# Patient Record
Sex: Female | Born: 1995 | Race: White | Hispanic: No | Marital: Single | Smoking: Never smoker
Health system: Southern US, Community
[De-identification: ages and names within clinical notes are randomized; demographics above are authoritative.]

## PROBLEM LIST (undated history)

## (undated) DIAGNOSIS — I10 Essential (primary) hypertension: Secondary | ICD-10-CM

## (undated) DIAGNOSIS — Z87442 Personal history of urinary calculi: Secondary | ICD-10-CM

## (undated) DIAGNOSIS — F32A Depression, unspecified: Secondary | ICD-10-CM

## (undated) DIAGNOSIS — F419 Anxiety disorder, unspecified: Secondary | ICD-10-CM

## (undated) HISTORY — PX: WISDOM TOOTH EXTRACTION: SHX21

---

## 2017-05-06 DIAGNOSIS — F419 Anxiety disorder, unspecified: Secondary | ICD-10-CM | POA: Diagnosis not present

## 2017-05-06 DIAGNOSIS — F329 Major depressive disorder, single episode, unspecified: Secondary | ICD-10-CM | POA: Diagnosis not present

## 2017-05-06 DIAGNOSIS — I10 Essential (primary) hypertension: Secondary | ICD-10-CM | POA: Diagnosis not present

## 2017-07-01 DIAGNOSIS — Z309 Encounter for contraceptive management, unspecified: Secondary | ICD-10-CM | POA: Diagnosis not present

## 2020-02-17 ENCOUNTER — Other Ambulatory Visit: Payer: Self-pay

## 2020-02-17 ENCOUNTER — Encounter (HOSPITAL_BASED_OUTPATIENT_CLINIC_OR_DEPARTMENT_OTHER): Payer: Self-pay | Admitting: *Deleted

## 2020-02-17 ENCOUNTER — Emergency Department (HOSPITAL_BASED_OUTPATIENT_CLINIC_OR_DEPARTMENT_OTHER): Payer: 59

## 2020-02-17 ENCOUNTER — Emergency Department (HOSPITAL_BASED_OUTPATIENT_CLINIC_OR_DEPARTMENT_OTHER)
Admission: EM | Admit: 2020-02-17 | Discharge: 2020-02-17 | Disposition: A | Discharge status: Home / Self Care | Payer: 59 | Attending: Emergency Medicine | Admitting: Emergency Medicine

## 2020-02-17 DIAGNOSIS — L509 Urticaria, unspecified: Secondary | ICD-10-CM

## 2020-02-17 DIAGNOSIS — I10 Essential (primary) hypertension: Secondary | ICD-10-CM | POA: Insufficient documentation

## 2020-02-17 DIAGNOSIS — R21 Rash and other nonspecific skin eruption: Secondary | ICD-10-CM | POA: Diagnosis present

## 2020-02-17 HISTORY — DX: Essential (primary) hypertension: I10

## 2020-02-17 HISTORY — DX: Anxiety disorder, unspecified: F41.9

## 2020-02-17 MED ORDER — FAMOTIDINE IN NACL 20-0.9 MG/50ML-% IV SOLN
20.0000 mg | Freq: Once | INTRAVENOUS | Status: AC
  Administered 2020-02-17: 20 mg via INTRAVENOUS
  Filled 2020-02-17: qty 50

## 2020-02-17 MED ORDER — SODIUM CHLORIDE 0.9 % IV SOLN
INTRAVENOUS | Status: DC | PRN
  Administered 2020-02-17: 100 mL via INTRAVENOUS

## 2020-02-17 MED ORDER — LABETALOL HCL 5 MG/ML IV SOLN
5.0000 mg | Freq: Once | INTRAVENOUS | Status: AC
  Administered 2020-02-17: 5 mg via INTRAVENOUS
  Filled 2020-02-17: qty 4

## 2020-02-17 MED ORDER — EPINEPHRINE 0.3 MG/0.3ML IJ SOAJ
0.3000 mg | Freq: Once | INTRAMUSCULAR | Status: AC
  Administered 2020-02-17: 0.3 mg via INTRAMUSCULAR
  Filled 2020-02-17: qty 0.3

## 2020-02-17 MED ORDER — DEXAMETHASONE SODIUM PHOSPHATE 10 MG/ML IJ SOLN
6.0000 mg | Freq: Once | INTRAMUSCULAR | Status: AC
  Administered 2020-02-17: 6 mg via INTRAVENOUS
  Filled 2020-02-17: qty 1

## 2020-02-17 MED ORDER — PREDNISONE 10 MG PO TABS: 20.0000 mg | ORAL_TABLET | Freq: Two times a day (BID) | ORAL | 0 refills | Status: AC

## 2020-02-17 MED ORDER — METHYLPREDNISOLONE SODIUM SUCC 125 MG IJ SOLR
125.0000 mg | Freq: Once | INTRAMUSCULAR | Status: AC
  Administered 2020-02-17: 125 mg via INTRAVENOUS
  Filled 2020-02-17: qty 2

## 2020-02-17 MED ORDER — DIPHENHYDRAMINE HCL 50 MG/ML IJ SOLN
50.0000 mg | Freq: Once | INTRAMUSCULAR | Status: AC
  Administered 2020-02-17: 50 mg via INTRAVENOUS
  Filled 2020-02-17: qty 1

## 2020-02-17 MED ORDER — EPINEPHRINE 0.3 MG/0.3ML IJ SOAJ: 0.3000 mg | INTRAMUSCULAR | 0 refills | Status: AC | PRN

## 2020-02-17 NOTE — ED Provider Notes (Signed)
[facility] EMERGENCY DEPARTMENT Provider Note   CSN: [id number] Arrival date & time: 02/17/20  1406     History Chief Complaint  Patient presents with  . Allergic Reaction    Carrie Bray is a 25 y.o. female who presents with concern for itchy rash on her face, neck, trunk, and arms for the last week.  Patient states that she had COVID-19 in November 2021 and subsequently had 2 episodes of seemingly spontaneous urticaria on her trunk and her arms each of which resolved after administration of Benadryl and Zyrtec.  Patient states she had a third episode of urticaria that erupted approximately 6 days ago.  Patient had a telemedicine visit and was prescribed a prednisone taper. She has additionally been taking Benadryl and Zyrtec at home.  She endorsed significant improvement with prednisone taper, however completed her taper yesterday. She states that she did not take any medicines yesterday after her morning dose of prednisone, and did not have any issue.  Unfortunately, she woke up this morning with swelling around her eyes, redness, urticaria, and itching to her face, neck, chest, and arms this morning.  She has not taken any medications at all today, but presented to the emergency department due to concern for persistent urticaria despite multiple medications at home.  Additionally patient has not taken hypertension medication due to concern that it may be contributing to her reaction.  I personally reviewed this patient's medical records.  She has history of anxiety and hypertension on carvedilol.  Patient has not taken carvedilol today. Hx of similar reaction to clindamycin in the past.  Patient denies any new medications, soaps, or exposures in her home.  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  HPI     Past Medical History:  Diagnosis Date  . Anxiety   . Hypertension     There are no problems to display for this patient.   History reviewed. No pertinent  surgical history.   OB History   No obstetric history on file.     History reviewed. No pertinent family history.  Social History   Tobacco Use  . Smoking status: Never Smoker  . Smokeless tobacco: Never Used  Vaping Use  . Vaping Use: Some days  Substance Use Topics  . Alcohol use: Yes    Comment: soc  . Drug use: Not Currently    Home Medications Prior to Admission medications   Medication Sig Start Date End Date Taking? Authorizing Provider  EPINEPHrine 0.3 mg/0.3 mL IJ SOAJ injection Inject 0.3 mg into the muscle as needed for anaphylaxis. 02/17/20  Yes Shoji Pertuit, Lupe Carney R, PA-C  predniSONE (DELTASONE) 10 MG tablet Take 2 tablets (20 mg total) by mouth 2 (two) times daily for 3 days. 02/17/20 02/20/20 Yes Sly Parlee, Eugene Gavia, PA-C    Allergies    Clindamycin  Review of Systems   Review of Systems  Constitutional: Negative for activity change, appetite change, chills, diaphoresis, fatigue, fever and unexpected weight change.  HENT: Negative.  Negative for sore throat, trouble swallowing and voice change.   Eyes: Negative.        Swelling around the eyes.   Respiratory: Positive for chest tightness. Negative for shortness of breath.   Cardiovascular: Negative.   Gastrointestinal: Negative.   Endocrine: Negative.   Genitourinary: Negative.   Musculoskeletal: Negative.   Skin: Positive for rash.       urticaria  Allergic/Immunologic: Negative.   Neurological: Negative.   Hematological: Negative.   Psychiatric/Behavioral:  Negative.     Physical Exam Updated Vital Signs BP (!) 148/97 (BP Location: Right Arm)   Pulse (!) 115   Temp 98 F (36.7 C) (Oral)   Resp 16   Ht 5\' 2"  (1.575 m)   Wt 67.1 kg   LMP 01/24/2020 (Exact Date)   SpO2 100%   BMI 27.07 kg/m   Physical Exam Vitals and nursing note reviewed.  Constitutional:      Appearance: Normal appearance. She is not ill-appearing.  HENT:     Head: Normocephalic and atraumatic.     Nose: Nose  normal.     Mouth/Throat:     Mouth: Mucous membranes are moist.     Pharynx: Oropharynx is clear. Uvula midline. No oropharyngeal exudate or posterior oropharyngeal erythema.     Tonsils: No tonsillar exudate.      Comments: No lingual edema or sublingual /submental tenderness to palpation.  No tonsillar edema, posterior pharyngeal erythema, or exudate.  No sign of abscess. Eyes:     General: Vision grossly intact.        Right eye: No discharge.        Left eye: No discharge.     Extraocular Movements: Extraocular movements intact.     Right eye: Normal extraocular motion.     Left eye: Normal extraocular motion.     Conjunctiva/sclera: Conjunctivae normal.     Pupils: Pupils are equal, round, and reactive to light.     Comments: Mild edema of the upper eye lids bilaterally.  Neck:     Trachea: Trachea and phonation normal.  Cardiovascular:     Rate and Rhythm: Normal rate and regular rhythm.     Pulses: Normal pulses.          Radial pulses are 2+ on the right side and 2+ on the left side.       Dorsalis pedis pulses are 2+ on the right side and 2+ on the left side.     Heart sounds: Normal heart sounds. No murmur heard.   Pulmonary:     Effort: Pulmonary effort is normal. No accessory muscle usage, prolonged expiration, respiratory distress or retractions.     Breath sounds: Normal breath sounds. No wheezing or rales.  Chest:     Chest wall: No lacerations, deformity, swelling, tenderness, crepitus or edema.    Abdominal:     General: Bowel sounds are normal. There is no distension.     Palpations: Abdomen is soft.     Tenderness: There is no abdominal tenderness. There is no right CVA tenderness, left CVA tenderness, guarding or rebound.    Musculoskeletal:        General: No deformity.     Cervical back: Full passive range of motion without pain, normal range of motion and neck supple. No rigidity, tenderness or crepitus. No pain with movement, spinous process  tenderness or muscular tenderness.     Right lower leg: No edema.     Left lower leg: No edema.  Lymphadenopathy:     Cervical: No cervical adenopathy.  Skin:    General: Skin is warm and dry.     Capillary Refill: Capillary refill takes less than 2 seconds.     Findings: Rash present. Rash is urticarial.  Neurological:     General: No focal deficit present.     Mental Status: She is alert and oriented to person, place, and time.     Cranial Nerves: Cranial nerves are intact.  Sensory: Sensation is intact.     Motor: Motor function is intact.     Gait: Gait is intact.  Psychiatric:        Mood and Affect: Mood normal.     ED Results / Procedures / Treatments   Labs (all labs ordered are listed, but only abnormal results are displayed) Labs Reviewed - No data to display  EKG EKG: normal EKG, normal sinus rhythm, unchanged from previous tracings, no STEMI.   Radiology DG Chest 2 View  Result Date: 02/17/2020 CLINICAL DATA:  Chest tightness after allergic reaction today. EXAM: CHEST - 2 VIEW COMPARISON:  None. FINDINGS: The heart size and mediastinal contours are within normal limits. No focal consolidation. No pleural effusion. No pneumothorax. The visualized skeletal structures are unremarkable. IMPRESSION: No acute cardiopulmonary disease. Electronically Signed   By: Maudry Mayhew MD   On: 02/17/2020 16:20    Procedures .Critical Care Performed by: Paris Lore, PA-C Authorized by: Paris Lore, PA-C   Critical care provider statement:    Critical care time (minutes):  45   Critical care was necessary to treat or prevent imminent or life-threatening deterioration of the following conditions: Refractory urticaria, administration of IM epinephrine.   Critical care was time spent personally by me on the following activities:  Discussions with consultants, evaluation of patient's response to treatment, examination of patient, ordering and performing  treatments and interventions, ordering and review of laboratory studies, ordering and review of radiographic studies, pulse oximetry, re-evaluation of patient's condition, obtaining history from patient or surrogate and review of old charts     Medications Ordered in ED Medications  0.9 %  sodium chloride infusion (0 mLs Intravenous Stopped 02/17/20 2110)  methylPREDNISolone sodium succinate (SOLU-MEDROL) 125 mg/2 mL injection 125 mg (125 mg Intravenous Given 02/17/20 1627)  diphenhydrAMINE (BENADRYL) injection 50 mg (50 mg Intravenous Given 02/17/20 1629)  famotidine (PEPCID) IVPB 20 mg premix (0 mg Intravenous Stopped 02/17/20 1800)  labetalol (NORMODYNE) injection 5 mg (5 mg Intravenous Given 02/17/20 1631)  dexamethasone (DECADRON) injection 6 mg (6 mg Intravenous Given 02/17/20 1801)  EPINEPHrine (EPI-PEN) injection 0.3 mg (0.3 mg Intramuscular Given 02/17/20 1852)    ED Course  I have reviewed the triage vital signs and the nursing notes.  Pertinent labs & imaging results that were available during my care of the patient were reviewed by me and considered in my medical decision making (see chart for details).  Clinical Course as of 02/18/20 0000  Fri Feb 17, 2020  1706 Patient was reevaluated approximately 30 minutes after administration of medications, with resolution of her itching and improvement in macular rash.  Mild lower left lip edema previously seen has now resolved.  Patient endorses resolution of tingling sensation in her lip as well.  Will continue to monitor. [RS]  1751 I was directly involved in this patients medical care.  [JH]  1920 Plan was to discharge patient at this time given resolution of itching and no recurrence after administration of Benadryl, Pepcid, and Solu-Medrol.  Unfortunately at the time of my reevaluation of the patient to inform her that she was to be discharged home, she has once again developed redness and swelling around her eyes bilaterally, mild edema  of her lower lip, and itching and tingling sensation across her face.  Given recurrent nature of her urticaria for the past week despite multiple doses of steroids and antihistamines, will proceed with IM epinephrine at this time.  There continues to be no compromise  of the patient's airway. [RS]  2130 Patient was administered IM epinephrine with significant improvement in her symptoms.  She endorses total resolution of her itchiness, lip swelling, eye swelling, and rash.  She states she feels better than she has since her urticaria began.  She feels very relieved.  Her hypertension has improved.  She is mildly tachycardic, I suspect secondary to administration of epinephrine.  On intake she was tachycardic secondary to her discomfort, but this tachycardia resolved after administration of initial medications with steroids and antihistamines.  Patient was monitored for 2 hours after administration of epinephrine without recurrence of her symptoms.  Additionally her airway remains without compromise, and her lungs remain clear to auscultation bilaterally. [RS]    Clinical Course User Index [JH] China, Eustace Moore, MD [RS] Prescilla Monger, Idelia Salm   MDM Rules/Calculators/A&P                         25 year old female with history of hypertension and anxiety presents with recurrent urticaria after completion of oral prednisone taper outpatient.  No clear cause for allergic reaction.  Patient hypertensive on intake to 169/128, tachycardic to 125, borderline febrile 99 F.  Oxygen saturation 100% on room air.  At the time of my initial exam, patient is longer tachycardic, however remains hypertensive.  She has not taken any antihypertensive medication today.  Cardiopulmonary exam is very reassuring, without abnormality.  Abdominal exam is benign.  Patient has erythematous macular rash on her chest, arms and face.  There is very mild left lower lip edema, but no sign of Ludwig's angina.  Patient appears  anxious.  Will proceed with chest x-ray and EKG given patient's sensation of chest tightness, will administer Solu-Medrol, Pepcid, Benadryl, and low-dose labetalol for hypertension.  Chest x-ray negative for acute cardiopulmonary disease, EKG very reassuring.  Please see ED course above for further insight into patient's progress through her stay in the emergency department.  Given resolution of symptoms, reassuring physical exam and vital signs, no further work-up is warranted in the ED at this time.  Will discharge with prescription for steroid burst and EpiPen.  Encourage close follow-up with primary care doctor, will provide contact information for allergist as well.  She may continue to take antihistamines at home as needed for recurrent symptoms.  Kavita voiced understanding of her medical evaluation and treatment plan.  Each of her questions was answered to her expresses satisfaction.  Return precautions given.  Patient is stable and appropriate for discharge at this time.  This chart was dictated using voice recognition software, Dragon. Despite the best efforts of this provider to proofread and correct errors, errors may still occur which can change documentation meaning.  Final Clinical Impression(s) / ED Diagnoses Final diagnoses:  Urticaria    Rx / DC Orders ED Discharge Orders         Ordered    predniSONE (DELTASONE) 10 MG tablet  2 times daily        02/17/20 2131    EPINEPHrine 0.3 mg/0.3 mL IJ SOAJ injection  As needed        02/17/20 2131           Sherrilee Gilles 02/18/20 0001    Cheryll Cockayne, MD 02/18/20 1547

## 2020-02-17 NOTE — Discharge Instructions (Addendum)
You were evaluated in the emergency department today for your hives, an apparent allergic reaction.  While exact cause for your allergic reaction remains unclear, we were able to treat it while in the emergency department.  You were administered steroids, antihistamines, and ultimately epinephrine to curb your reaction with success.  Your heart rate is elevated at the time of discharge, likely secondary to administration of epinephrine.  Additionally blood pressure is elevated, likely secondary to discomfort and to the fact that you missed your blood pressure medications for the past 2 days.  Please continue take your medication as prescribed once you are at home.  You have been prescribed 3 more days of steroid treatment at home, as well as a prescription for an EpiPen.  You may use this as specified on your prescription.  Additionally you may continue to take Benadryl as needed at home.   You should return to the emergency department if you develop any chest pain, difficulty breathing, swelling of your tongue or throat, difficulty swallowing or speaking, severe headache, nausea or vomiting that does not stop, or any other new severe symptoms.  Below is the contact information for an allergist in the area.  You may call them to schedule follow-up appointment.

## 2020-02-17 NOTE — ED Triage Notes (Signed)
C/o ongoing allergic reaction x 1 week , started on prednisone taper, completed yesterday and facial swelling and rash . No resp distress

## 2020-02-17 NOTE — ED Notes (Signed)
Swelling of Eyes continues to improve and she states the itching is much better

## 2020-02-17 NOTE — ED Notes (Signed)
ED Provider at bedside. 

## 2021-02-01 ENCOUNTER — Other Ambulatory Visit: Payer: Self-pay | Admitting: Urology

## 2021-02-01 NOTE — Progress Notes (Signed)
Patient to arrive at 0645 on 02/04/2021. History and medications reviewed. Pre-procedure instructions given. NPO after MN on Sunday except for clear liquids until 0445. BP meds with sip of water morning of procedure. Driver secured.

## 2021-02-04 ENCOUNTER — Encounter (HOSPITAL_BASED_OUTPATIENT_CLINIC_OR_DEPARTMENT_OTHER)
Admission: RE | Disposition: A | Discharge status: Home / Self Care | Payer: Self-pay | Source: Home / Self Care | Attending: Urology

## 2021-02-04 ENCOUNTER — Ambulatory Visit (HOSPITAL_COMMUNITY): Payer: 59

## 2021-02-04 ENCOUNTER — Ambulatory Visit (HOSPITAL_BASED_OUTPATIENT_CLINIC_OR_DEPARTMENT_OTHER)
Admission: RE | Admit: 2021-02-04 | Discharge: 2021-02-04 | Disposition: A | Discharge status: Home / Self Care | Payer: 59 | Attending: Urology | Admitting: Urology

## 2021-02-04 ENCOUNTER — Encounter (HOSPITAL_BASED_OUTPATIENT_CLINIC_OR_DEPARTMENT_OTHER): Payer: Self-pay | Admitting: Urology

## 2021-02-04 DIAGNOSIS — N202 Calculus of kidney with calculus of ureter: Secondary | ICD-10-CM | POA: Diagnosis not present

## 2021-02-04 DIAGNOSIS — N201 Calculus of ureter: Secondary | ICD-10-CM | POA: Diagnosis present

## 2021-02-04 DIAGNOSIS — Z793 Long term (current) use of hormonal contraceptives: Secondary | ICD-10-CM | POA: Diagnosis not present

## 2021-02-04 DIAGNOSIS — Z01818 Encounter for other preprocedural examination: Secondary | ICD-10-CM

## 2021-02-04 DIAGNOSIS — I10 Essential (primary) hypertension: Secondary | ICD-10-CM | POA: Diagnosis not present

## 2021-02-04 DIAGNOSIS — Z79899 Other long term (current) drug therapy: Secondary | ICD-10-CM | POA: Diagnosis not present

## 2021-02-04 HISTORY — PX: EXTRACORPOREAL SHOCK WAVE LITHOTRIPSY: SHX1557

## 2021-02-04 LAB — POCT PREGNANCY, URINE: Preg Test, Ur: NEGATIVE

## 2021-02-04 SURGERY — LITHOTRIPSY, ESWL
Anesthesia: LOCAL | Laterality: Right

## 2021-02-04 MED ORDER — DIPHENHYDRAMINE HCL 25 MG PO CAPS
ORAL_CAPSULE | ORAL | Status: AC
  Filled 2021-02-04: qty 1

## 2021-02-04 MED ORDER — DIAZEPAM 5 MG PO TABS
ORAL_TABLET | ORAL | Status: AC
  Filled 2021-02-04: qty 2

## 2021-02-04 MED ORDER — DIAZEPAM 5 MG PO TABS
10.0000 mg | ORAL_TABLET | ORAL | Status: AC
  Administered 2021-02-04: 10 mg via ORAL

## 2021-02-04 MED ORDER — CIPROFLOXACIN HCL 500 MG PO TABS
500.0000 mg | ORAL_TABLET | ORAL | Status: AC
Start: 2021-02-04 — End: 2021-02-04
  Administered 2021-02-04: 500 mg via ORAL

## 2021-02-04 MED ORDER — SODIUM CHLORIDE 0.9 % IV SOLN: INTRAVENOUS | Status: DC

## 2021-02-04 MED ORDER — CIPROFLOXACIN HCL 500 MG PO TABS
ORAL_TABLET | ORAL | Status: AC
  Filled 2021-02-04: qty 1

## 2021-02-04 MED ORDER — DIPHENHYDRAMINE HCL 25 MG PO CAPS
25.0000 mg | ORAL_CAPSULE | ORAL | Status: AC
  Administered 2021-02-04: 25 mg via ORAL

## 2021-02-04 NOTE — Discharge Instructions (Signed)
1 - You may have urinary urgency (bladder spasms) and bloody urine, pass small stone fragments, on / off with stent in place. This is normal.  2 - Call MD or go to ER for fever >102, severe pain / nausea / vomiting not relieved by medications, or acute change in medical status

## 2021-02-04 NOTE — Brief Op Note (Signed)
02/04/2021  9:03 AM  PATIENT:  Kae Heller  26 y.o. female  PRE-OPERATIVE DIAGNOSIS:  RIGHT URETERAL STONE  POST-OPERATIVE DIAGNOSIS:  * No post-op diagnosis entered *  PROCEDURE:  Procedure(s): RIGHT EXTRACORPOREAL SHOCK WAVE LITHOTRIPSY (ESWL) (Right)  SURGEON:  Surgeon(s) and Role:    * Alexis Frock, MD - Primary  PHYSICIAN ASSISTANT:   ASSISTANTS: none   ANESTHESIA:   MAC  EBL:  minimal   BLOOD ADMINISTERED:none  DRAINS: none   LOCAL MEDICATIONS USED:  NONE  SPECIMEN:  No Specimen  DISPOSITION OF SPECIMEN:  N/A  COUNTS:  YES  TOURNIQUET:  * No tourniquets in log *  DICTATION: .Note written in paper chart  PLAN OF CARE: Discharge to home after PACU  PATIENT DISPOSITION:  PACU - hemodynamically stable.   Delay start of Pharmacological VTE agent (>24hrs) due to surgical blood loss or risk of bleeding: not applicable

## 2021-02-04 NOTE — Progress Notes (Signed)
Splotchy red area on right flank from lithotripsy.

## 2021-02-04 NOTE — H&P (Signed)
Carrie Bray is an 26 y.o. female.    Chief Complaint: Pre-Op RIGHTShockwave LIthotripsy  HPI:   1 -LEFT Ureteral, Bilateral Renal Stones - RIGHT L2-3 upper ureteral stone by recetn CT on eval flank pain, Some bilatera scatterd puncteat non-obstruting as well.   Today "Carrie Bray" is seen to proceed with RIGHT SWL for mid ureteral Stone. No interval fevers. Most recent UCX negative, and UA w/o infectious parameters.   Past Medical History:  Diagnosis Date   Anxiety    Hypertension     History reviewed. No pertinent surgical history.  History reviewed. No pertinent family history. Social History:  reports that she has never smoked. She has never used smokeless tobacco. She reports current alcohol use. She reports that she does not currently use drugs.  Allergies:  Allergies  Allergen Reactions   Clindamycin Hives    Medications Prior to Admission  Medication Sig Dispense Refill   ciprofloxacin (CIPRO) 500 MG tablet Take 500 mg by mouth 2 (two) times daily.     medroxyPROGESTERone (DEPO-PROVERA) 150 MG/ML injection Inject 150 mg into the muscle every 3 (three) months.     methylphenidate (RITALIN) 10 MG tablet Take 10 mg by mouth daily.     metoprolol succinate (TOPROL-XL) 25 MG 24 hr tablet Take 25 mg by mouth daily.     oxyCODONE-acetaminophen (PERCOCET/ROXICET) 5-325 MG tablet Take 1 tablet by mouth every 6 (six) hours as needed for severe pain.     tamsulosin (FLOMAX) 0.4 MG CAPS capsule Take 0.4 mg by mouth daily.     valsartan (DIOVAN) 160 MG tablet Take 160 mg by mouth daily.     EPINEPHrine 0.3 mg/0.3 mL IJ SOAJ injection Inject 0.3 mg into the muscle as needed for anaphylaxis. 1 each 0    Results for orders placed or performed during the hospital encounter of 02/04/21 (from the past 48 hour(s))  Pregnancy, urine POC     Status: None   Collection Time: 02/04/21  6:56 AM  Result Value Ref Range   Preg Test, Ur NEGATIVE NEGATIVE    Comment:        THE SENSITIVITY  OF THIS METHODOLOGY IS >24 mIU/mL    No results found.  Review of Systems  Constitutional:  Negative for chills and fever.  Genitourinary:  Positive for flank pain.  All other systems reviewed and are negative.  Blood pressure 128/86, pulse 86, temperature 98.5 F (36.9 C), temperature source Oral, resp. rate 16, height 5\' 3"  (1.6 m), weight 65 kg, SpO2 98 %. Physical Exam Vitals reviewed.  HENT:     Head: Normocephalic.     Nose: Nose normal.  Eyes:     Pupils: Pupils are equal, round, and reactive to light.  Cardiovascular:     Rate and Rhythm: Normal rate.  Abdominal:     General: Abdomen is flat.  Genitourinary:    Comments: Mild RIght CVAT Musculoskeletal:        General: Normal range of motion.     Cervical back: Normal range of motion.  Skin:    General: Skin is warm.  Neurological:     Mental Status: She is alert.  Psychiatric:        Mood and Affect: Mood normal.     Assessment/Plan Proceed as planned with RIGHT shockwave lithotripsy. Risks, benefits, alternatives, expected peri-op course discussed previously and reiterated today.   Alexis Frock, MD 02/04/2021, 7:29 AM

## 2021-02-06 ENCOUNTER — Encounter (HOSPITAL_BASED_OUTPATIENT_CLINIC_OR_DEPARTMENT_OTHER): Payer: Self-pay | Admitting: Urology

## 2022-02-11 IMAGING — DX DG ABDOMEN 1V
2 series · 2 of 2 positions shown · non-contrast
Comparison: January 31, 2021

CLINICAL DATA: Pre lithotripsy

EXAM:
ABDOMEN - 1 VIEW

[abdomen kub (1 of 2)]
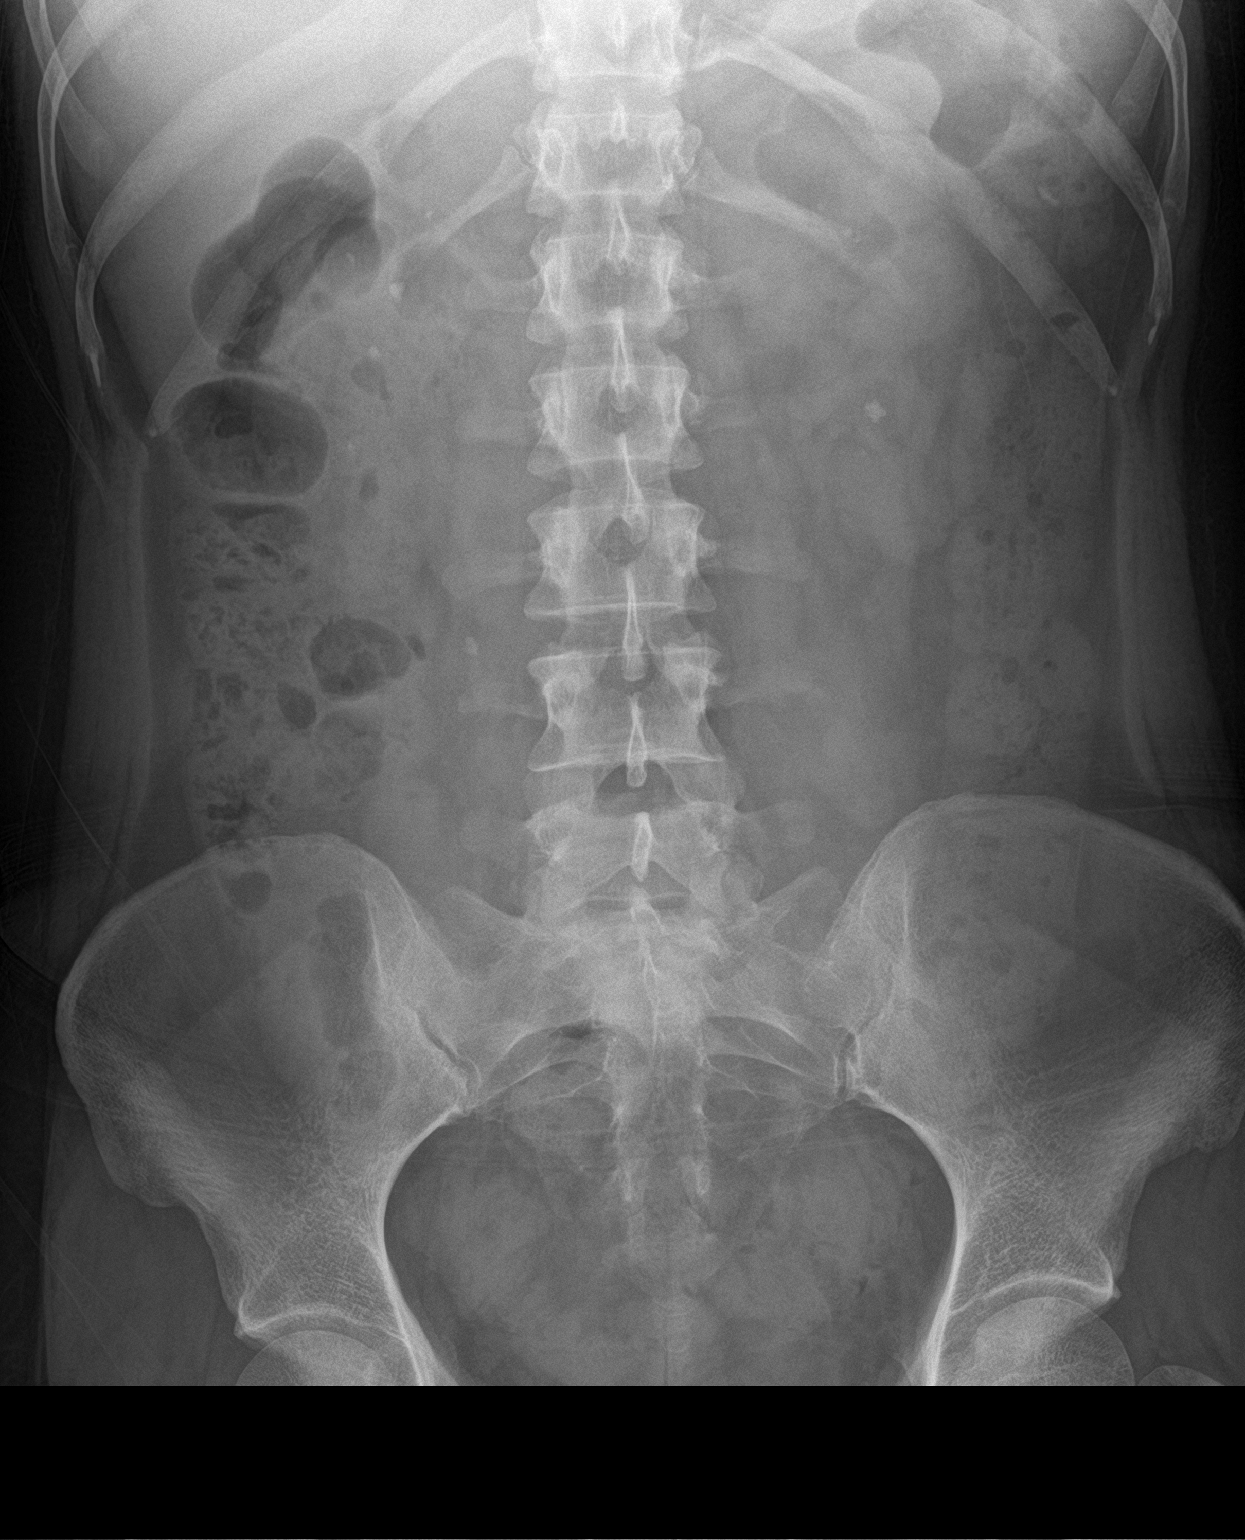

[abdomen kub (2 of 2)]
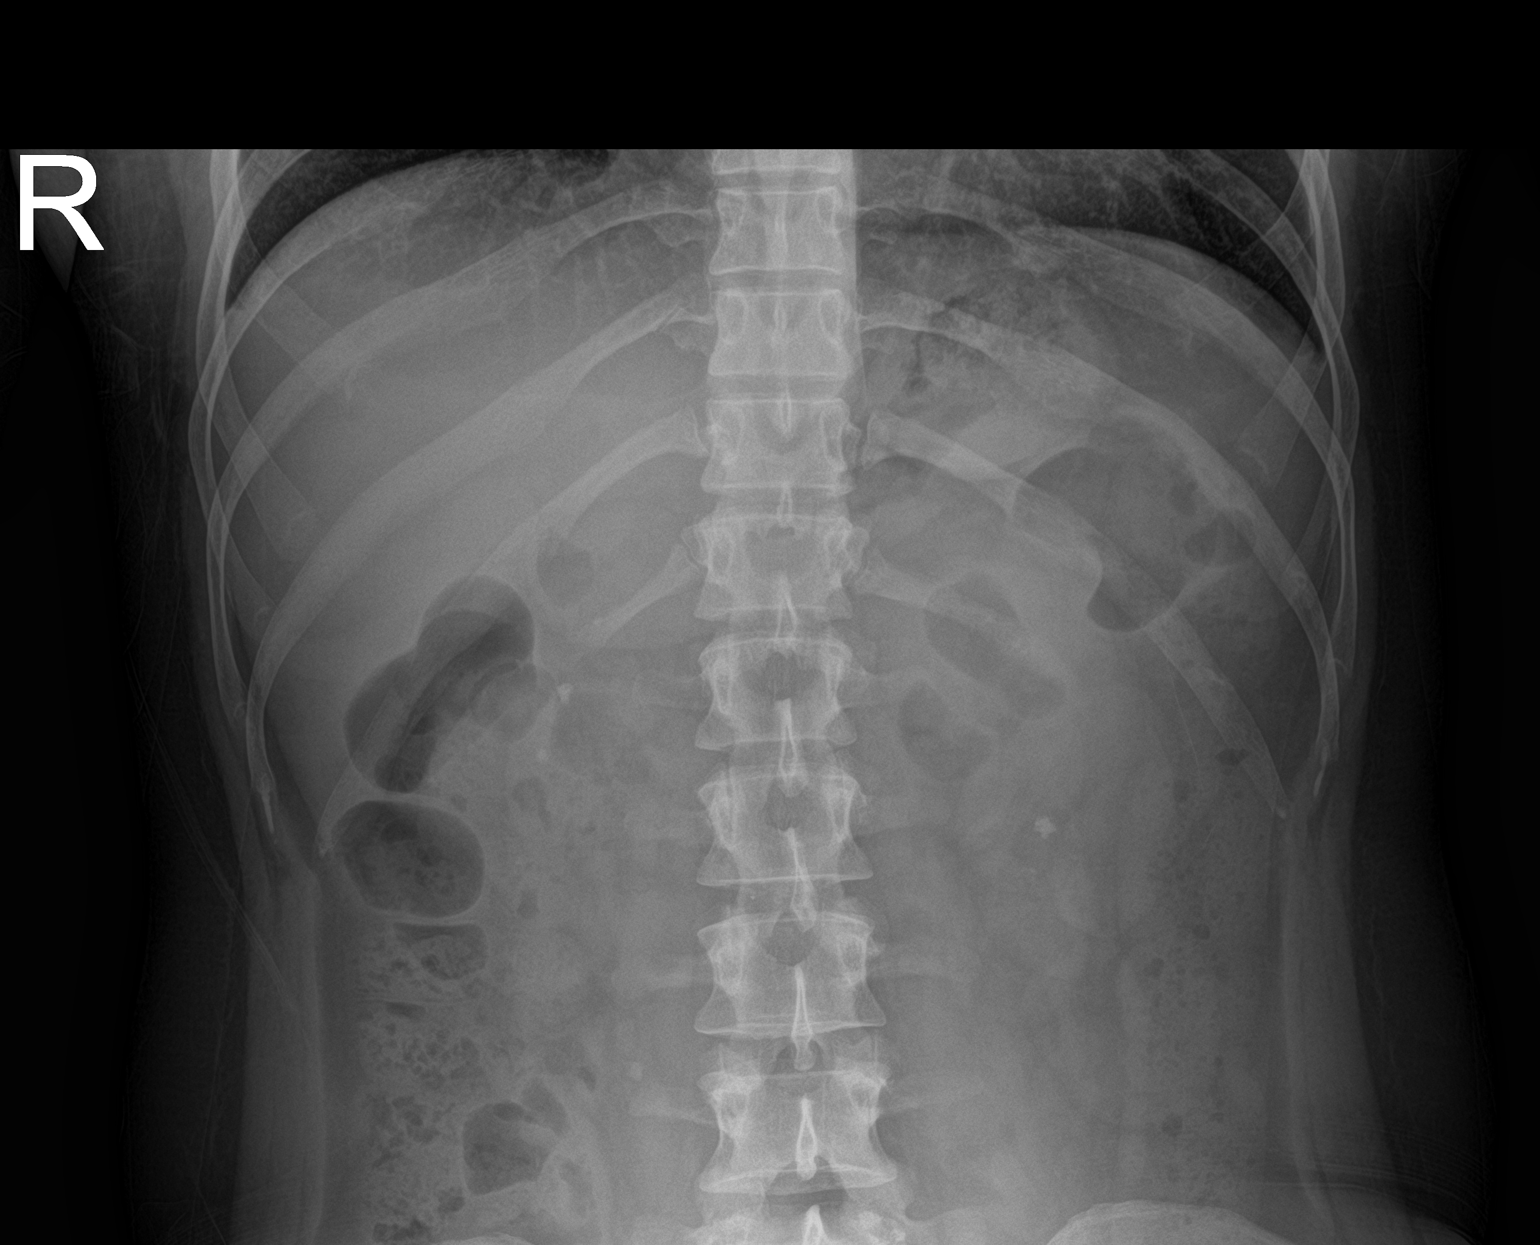

[2 of 2 positions shown; findings below may reference images not displayed]

FINDINGS: Air and stool filled nondilated loops of bowel. Moderate colonic
stool burden diffusely throughout the colon. Revisualization of a
3-4 mm RIGHT mid ureter ureterolithiasis, unchanged comparison to
prior. Multiple additional radiopaque densities consistent with
nephrolithiasis project over the contours of bilateral kidneys, the
largest of which measures 4 mm bilaterally. Visualized lung bases
are unremarkable. Pelvic phlebolith.
IMPRESSION: Similar appearance of a RIGHT-sided ureterolithiasis projecting over
the RIGHT mid ureter as well as multiple bilateral additional
nephrolithiasis.

## 2023-01-26 ENCOUNTER — Other Ambulatory Visit: Payer: Self-pay | Admitting: Urology

## 2023-01-30 NOTE — H&P (Signed)
 CC/HPI: cc: urolithiasis   01/23/23: 28 year old woman with a history of urolithiasis who last underwent right ESWL in January 2023 now comes in with a 6 mm left UPJ calculus and bilateral nonobstructing renal calculi. She was seen in the ED earlier this morning. CT of abdomen pelvis was reviewed. No fevers, chills, nausea or vomiting. She does respond to Toradol. She did not have a good experience with ESWL last year and had severe pain following this procedure.     ALLERGIES: Clindamycin Clindamycin Palmitate    MEDICATIONS: Cipro  Bupropion Hcl  METOPROLOL SUCCINATE PO Daily  VALSARTAN PO Daily     GU PSH: ESWL - 02/04/2021     NON-GU PSH: No Non-GU PSH    GU PMH: Ureteral calculus - 02/05/2021, - 01/31/2021 Flank Pain - 01/31/2021 Gross hematuria - 01/31/2021    NON-GU PMH: Bacteriuria - 01/31/2021 Anxiety - 2017 Depression - 2017 Hypertension - 2016 GERD    FAMILY HISTORY: Diabetes - Runs In Family Heart Disease - Runs In Family High Blood Pressure - Runs In Family Kidney Failure - Grandmother   SOCIAL HISTORY: Marital Status: Single Preferred Language: English; Ethnicity: Not Hispanic Or Latino; Race: White Current Smoking Status: Patient does not smoke anymore. Has not smoked since 07/29/2017. Smoked 1 pack per day.   Tobacco Use Assessment Completed: Used Tobacco in last 30 days? Does not use smokeless tobacco. Drinks 4 drinks per month. Social Drinker.  Does not use drugs. Drinks 2 caffeinated drinks per day. Has not had a blood transfusion. 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    REVIEW OF SYSTEMS:    GU Review Female:   Patient denies frequent urination, hard to postpone urination, burning /pain with urination, get up at night to urinate, leakage of urine, stream starts and stops, trouble starting your stream, have to strain to urinate, and being pregnant.  Gastrointestinal (Upper):   Patient denies nausea, vomiting, and indigestion/ heartburn.  Gastrointestinal  (Lower):   Patient denies diarrhea and constipation.  Constitutional:   Patient denies fever, night sweats, weight loss, and fatigue.  Skin:   Patient denies skin rash/ lesion and itching.  Eyes:   Patient denies blurred vision and double vision.  Ears/ Nose/ Throat:   Patient denies sore throat and sinus problems.  Hematologic/Lymphatic:   Patient denies swollen glands and easy bruising.  Cardiovascular:   Patient denies leg swelling and chest pains.  Respiratory:   Patient denies cough and shortness of breath.  Endocrine:   Patient denies excessive thirst.  Musculoskeletal:   Patient denies back pain and joint pain.  Neurological:   Patient denies headaches and dizziness.  Psychologic:   Patient denies depression and anxiety.   Notes: Left flank pain    VITAL SIGNS:      01/23/2023 03:12 PM  BP 134/86 mmHg  Pulse 112 /min  Temperature 98.2 F / 36.7 C   MULTI-SYSTEM PHYSICAL EXAMINATION:    Constitutional: Well-nourished. No physical deformities. Normally developed. Good grooming.  Neck: Neck symmetrical, not swollen. Normal tracheal position.  Respiratory: No labored breathing, no use of accessory muscles.   Skin: No paleness, no jaundice, no cyanosis. No lesion, no ulcer, no rash.  Neurologic / Psychiatric: Oriented to time, oriented to place, oriented to person. No depression, no anxiety, no agitation.  Eyes: Normal conjunctivae. Normal eyelids.  Ears, Nose, Mouth, and Throat: Left ear no scars, no lesions, no masses. Right ear no scars, no lesions, no masses. Nose no scars, no lesions, no masses. Normal  hearing. Normal lips.  Musculoskeletal: Normal gait and station of head and neck.     Complexity of Data:  Records Review:   Previous Patient Records, POC Tool  Urine Test Review:   Urinalysis  X-Ray Review: C.T. Abdomen/Pelvis: Reviewed Films. Reviewed Report. Discussed With Patient.  IMPRESSION:  1. Mild left hydronephrosis related to an obstructive 6 mm left   ureteropelvic junction stone.  2. Additional bilateral nonobstructive renal calculi.    Electronically Signed  By: Gaylyn Rong M.D.  On: 01/22/2023 21:47    PROCEDURES:          Urinalysis w/Scope Dipstick Dipstick Cont'd Micro  Color: Yellow Bilirubin: Neg mg/dL WBC/hpf: NS (Not Seen)  Appearance: Cloudy Ketones: Neg mg/dL RBC/hpf: 40 - 40/JWJ  Specific Gravity: 1.025 Blood: 3+ ery/uL Bacteria: Rare (0-9/hpf)  pH: 6.0 Protein: Trace mg/dL Cystals: NS (Not Seen)  Glucose: Neg mg/dL Urobilinogen: 0.2 mg/dL Casts: NS (Not Seen)    Nitrites: Neg Trichomonas: Not Present    Leukocyte Esterase: Neg leu/uL Mucous: Present      Epithelial Cells: 0 - 5/hpf      Yeast: NS (Not Seen)      Sperm: Not Present         Ketoralac 30mg  - [id number], [id number] 0 Wasted    Qty: 30 Adm. By: Kasandra Knudsen, M.D.  Unit: mg Lot No [id number]  Route: IM Exp. Date 11/14/2023  Freq: None Mfgr.:   Site: Right Buttock   ASSESSMENT:      ICD-10 Details  1 GU:   Ureteral calculus - N20.1 Acute, Uncomplicated  2   Microscopic hematuria - R31.21 Undiagnosed New Problem  3   Renal calculus - N20.0 Chronic, Stable   PLAN:            Medications New Meds: Ketorolac Tromethamine 10 mg tablet 1 tablet PO Q 8 H PRN pt tolerated IV toradol in ED  #15  0 Refill(s)  Tamsulosin Hcl 0.4 mg capsule 1 capsule PO Q HS   #30  3 Refill(s)  Pharmacy Name:  [id number]  Address:  [address]   [place] [address]  Phone:  [phone]  Fax:  [phone]            Document Letter(s):  Created for Patient: Clinical Summary         Notes:   Urolithiasis:  -Patient received Toradol in the office today and this was sent to the pharmacy as well as tamsulosin. She did have a prescription for hydrocodone and Zofran sent from the ED earlier this morning.  -We discussed ESWL again versus left-sided ureteroscopy. Patient did not have a good experience with ESWL and would like to move  forward with left-sided ureteroscopy to clear the entire side. Will address the possibility of a right sided ureteroscopy in the future. Risks and benefits of the procedure were discussed including but not limited to pain, bleeding, infection, damage to surrounding structures, ureteral stricture, need for staged procedure, inability to remove stones, stent discomfort.  -She is given strict return precautions including fever and systemic symptoms

## 2023-02-03 ENCOUNTER — Encounter (HOSPITAL_BASED_OUTPATIENT_CLINIC_OR_DEPARTMENT_OTHER): Payer: Self-pay | Admitting: Urology

## 2023-02-03 NOTE — Progress Notes (Signed)
 Spoke w/ via phone for pre-op interview--- [employer]---- UPT, ISTAT and EKG per anesthesia        Lab results------ COVID test -----patient states asymptomatic no test needed Arrive at -------0615 NPO after MN NO Solid Food.   Med rec completed Medications to take morning of surgery -----Metoprolol, Flomax,Lexapro,Wellbutrin and Percocet PRN. Diabetic medication ----- Patient instructed no nail polish to be worn day of surgery Patient instructed to bring photo id and insurance card day of surgery Patient aware to have Driver (ride ) / caregiver    for 24 hours after surgery - Mother Barnie Glatter Patient Special Instructions ----- Pre-Op special Instructions ----- Patient verbalized understanding of instructions that were given at this phone interview. Patient denies chest pain, sob, fever, cough at the interview.

## 2023-02-05 ENCOUNTER — Ambulatory Visit (HOSPITAL_BASED_OUTPATIENT_CLINIC_OR_DEPARTMENT_OTHER)
Admission: RE | Admit: 2023-02-05 | Discharge: 2023-02-05 | Disposition: A | Discharge status: Home / Self Care | Payer: Commercial Managed Care - PPO | Attending: Urology | Admitting: Urology

## 2023-02-05 ENCOUNTER — Encounter (HOSPITAL_BASED_OUTPATIENT_CLINIC_OR_DEPARTMENT_OTHER): Payer: Self-pay | Admitting: Urology

## 2023-02-05 ENCOUNTER — Encounter (HOSPITAL_BASED_OUTPATIENT_CLINIC_OR_DEPARTMENT_OTHER)
Admission: RE | Disposition: A | Discharge status: Home / Self Care | Payer: Self-pay | Source: Home / Self Care | Attending: Urology

## 2023-02-05 ENCOUNTER — Encounter (HOSPITAL_BASED_OUTPATIENT_CLINIC_OR_DEPARTMENT_OTHER): Payer: Self-pay | Admitting: Anesthesiology

## 2023-02-05 DIAGNOSIS — Z01818 Encounter for other preprocedural examination: Secondary | ICD-10-CM

## 2023-02-05 DIAGNOSIS — Z538 Procedure and treatment not carried out for other reasons: Secondary | ICD-10-CM | POA: Insufficient documentation

## 2023-02-05 DIAGNOSIS — N202 Calculus of kidney with calculus of ureter: Secondary | ICD-10-CM | POA: Insufficient documentation

## 2023-02-05 DIAGNOSIS — R3129 Other microscopic hematuria: Secondary | ICD-10-CM | POA: Insufficient documentation

## 2023-02-05 DIAGNOSIS — I10 Essential (primary) hypertension: Secondary | ICD-10-CM

## 2023-02-05 HISTORY — DX: Depression, unspecified: F32.A

## 2023-02-05 HISTORY — DX: Personal history of urinary calculi: Z87.442

## 2023-02-05 LAB — POCT PREGNANCY, URINE: Preg Test, Ur: NEGATIVE

## 2023-02-05 SURGERY — CYSTOSCOPY/URETEROSCOPY/HOLMIUM LASER/STENT PLACEMENT
Anesthesia: General | Laterality: Left

## 2023-02-05 MED ORDER — CEFAZOLIN SODIUM-DEXTROSE 2-4 GM/100ML-% IV SOLN
INTRAVENOUS | Status: AC
  Filled 2023-02-05: qty 100

## 2023-02-05 MED ORDER — ACETAMINOPHEN 500 MG PO TABS: 1000.0000 mg | ORAL_TABLET | Freq: Once | ORAL | Status: DC

## 2023-02-05 MED ORDER — PROPOFOL 10 MG/ML IV BOLUS
INTRAVENOUS | Status: AC
  Filled 2023-02-05: qty 20

## 2023-02-05 MED ORDER — ONDANSETRON HCL 4 MG/2ML IJ SOLN
INTRAMUSCULAR | Status: AC
  Filled 2023-02-05: qty 2

## 2023-02-05 MED ORDER — FENTANYL CITRATE (PF) 100 MCG/2ML IJ SOLN
INTRAMUSCULAR | Status: AC
  Filled 2023-02-05: qty 2

## 2023-02-05 MED ORDER — ACETAMINOPHEN 500 MG PO TABS
ORAL_TABLET | ORAL | Status: AC
  Filled 2023-02-05: qty 2

## 2023-02-05 MED ORDER — LIDOCAINE HCL (PF) 2 % IJ SOLN
INTRAMUSCULAR | Status: AC
Start: 2023-02-05 — End: ?
  Filled 2023-02-05: qty 5

## 2023-02-05 MED ORDER — ARTIFICIAL TEARS OPHTHALMIC OINT
TOPICAL_OINTMENT | OPHTHALMIC | Status: AC
  Filled 2023-02-05: qty 3.5

## 2023-02-05 MED ORDER — MIDAZOLAM HCL 2 MG/2ML IJ SOLN
INTRAMUSCULAR | Status: AC
  Filled 2023-02-05: qty 2

## 2023-02-05 MED ORDER — DEXAMETHASONE SODIUM PHOSPHATE 10 MG/ML IJ SOLN
INTRAMUSCULAR | Status: AC
Start: 2023-02-05 — End: ?
  Filled 2023-02-05: qty 1

## 2023-02-05 MED ORDER — CEFAZOLIN SODIUM-DEXTROSE 2-4 GM/100ML-% IV SOLN: 2.0000 g | INTRAVENOUS | Status: DC

## 2023-02-05 MED ORDER — LACTATED RINGERS IV SOLN: INTRAVENOUS | Status: DC

## 2023-02-05 SURGICAL SUPPLY — 4 items
BASKET ZERO TIP NITINOL 2.4FR (BASKET)
DRSG TEGADERM 4X4.75 (GAUZE/BANDAGES/DRESSINGS)
EXTRACTOR STONE 1.7FRX115CM (UROLOGICAL SUPPLIES)
TRACTIP FLEXIVA PULS ID 200XHI (Laser)

## 2023-02-05 NOTE — Progress Notes (Signed)
Case cancelled per Dr. Bradley Ferris and Dr. Kasandra Knudsen due to patient having positive covid test 01/24/2023 and symptoms until 01/27/2023. To be rescheduled for next week and Patient aware and verbalizes understanding.

## 2023-02-05 NOTE — Anesthesia Preprocedure Evaluation (Signed)
Anesthesia Evaluation    Reviewed: Allergy & Precautions, Patient's Chart, lab work & pertinent test results  Airway        Dental   Pulmonary neg pulmonary ROS, Patient abstained from smoking.          Cardiovascular hypertension, Pt. on home beta blockers and Pt. on medications      Neuro/Psych  PSYCHIATRIC DISORDERS Anxiety Depression    negative neurological ROS     GI/Hepatic negative GI ROS, Neg liver ROS,,,  Endo/Other  negative endocrine ROS    Renal/GU negative Renal ROS     Musculoskeletal negative musculoskeletal ROS (+)    Abdominal   Peds  Hematology negative hematology ROS (+)   Anesthesia Other Findings LEFT URETERAL AND RENAL CALCULI  Reproductive/Obstetrics                             Anesthesia Physical Anesthesia Plan  ASA:   Anesthesia Plan: General   Post-op Pain Management:    Induction:   PONV Risk Score and Plan:   Airway Management Planned:   Additional Equipment:   Intra-op Plan:   Post-operative Plan:   Informed Consent:   Plan Discussed with:   Anesthesia Plan Comments:        Anesthesia Quick Evaluation

## 2023-02-09 ENCOUNTER — Other Ambulatory Visit: Payer: Self-pay | Admitting: Urology

## 2023-02-12 ENCOUNTER — Other Ambulatory Visit: Payer: Self-pay

## 2023-02-12 ENCOUNTER — Encounter (HOSPITAL_BASED_OUTPATIENT_CLINIC_OR_DEPARTMENT_OTHER): Payer: Self-pay | Admitting: Urology

## 2023-02-12 NOTE — Progress Notes (Signed)
Spoke w/ via phone for pre-op interview---[facility] needs dos----  ISTAT, EKG, UPT       Lab results------07/25/2020 Echo in Care Everywhere COVID test -----patient states asymptomatic no test needed Arrive at -------0945 on Friday, 02/13/23 NPO after MN NO Solid Food.  Clear liquids from MN until---0845 Med rec completed Medications to take morning of surgery -----Wellbutrin, Zyrtec, Lexapro, Metoprolol, Flomax, Oxycodone prn Diabetic medication -----n/a Patient instructed no nail polish to be worn day of surgery Patient instructed to bring photo id and insurance card day of surgery Patient aware to have Driver (ride ) / caregiver    for 24 hours after surgery - mother, Gavin Pound Patient Special Instructions -----none Pre-Op special Instructions -----none Patient verbalized understanding of instructions that were given at this phone interview. Patient denies chest pain, sob, fever, cough at the interview.   Patient was originally scheduled on 02/05/23. Case was cancelled due to patient having a positive covid test on 01/24/23 with symptoms until 01/27/23. Pre-op phone call was done on 02/03/23 by Kerry Kass, RN. Patient stated no changes in medications or history since that time.

## 2023-02-13 ENCOUNTER — Ambulatory Visit (HOSPITAL_BASED_OUTPATIENT_CLINIC_OR_DEPARTMENT_OTHER): Payer: Commercial Managed Care - PPO | Admitting: Anesthesiology

## 2023-02-13 ENCOUNTER — Ambulatory Visit (HOSPITAL_COMMUNITY): Payer: Commercial Managed Care - PPO

## 2023-02-13 ENCOUNTER — Ambulatory Visit (HOSPITAL_BASED_OUTPATIENT_CLINIC_OR_DEPARTMENT_OTHER)
Admission: RE | Admit: 2023-02-13 | Discharge: 2023-02-13 | Disposition: A | Discharge status: Home / Self Care | Payer: Commercial Managed Care - PPO | Attending: Urology | Admitting: Urology

## 2023-02-13 ENCOUNTER — Other Ambulatory Visit: Payer: Self-pay

## 2023-02-13 ENCOUNTER — Encounter (HOSPITAL_BASED_OUTPATIENT_CLINIC_OR_DEPARTMENT_OTHER)
Admission: RE | Disposition: A | Discharge status: Home / Self Care | Payer: Self-pay | Source: Home / Self Care | Attending: Urology

## 2023-02-13 ENCOUNTER — Encounter (HOSPITAL_BASED_OUTPATIENT_CLINIC_OR_DEPARTMENT_OTHER): Payer: Self-pay | Admitting: Urology

## 2023-02-13 DIAGNOSIS — F419 Anxiety disorder, unspecified: Secondary | ICD-10-CM | POA: Insufficient documentation

## 2023-02-13 DIAGNOSIS — F32A Depression, unspecified: Secondary | ICD-10-CM | POA: Diagnosis not present

## 2023-02-13 DIAGNOSIS — Z79899 Other long term (current) drug therapy: Secondary | ICD-10-CM | POA: Diagnosis not present

## 2023-02-13 DIAGNOSIS — Z8249 Family history of ischemic heart disease and other diseases of the circulatory system: Secondary | ICD-10-CM | POA: Insufficient documentation

## 2023-02-13 DIAGNOSIS — I10 Essential (primary) hypertension: Secondary | ICD-10-CM | POA: Diagnosis not present

## 2023-02-13 DIAGNOSIS — N132 Hydronephrosis with renal and ureteral calculous obstruction: Secondary | ICD-10-CM | POA: Insufficient documentation

## 2023-02-13 DIAGNOSIS — Z8616 Personal history of COVID-19: Secondary | ICD-10-CM | POA: Insufficient documentation

## 2023-02-13 DIAGNOSIS — Z87891 Personal history of nicotine dependence: Secondary | ICD-10-CM | POA: Diagnosis not present

## 2023-02-13 DIAGNOSIS — Z01818 Encounter for other preprocedural examination: Secondary | ICD-10-CM

## 2023-02-13 HISTORY — PX: CYSTOSCOPY/URETEROSCOPY/HOLMIUM LASER/STENT PLACEMENT: SHX6546

## 2023-02-13 LAB — POCT I-STAT, CHEM 8
BUN: 8 mg/dL (ref 6–20)
Calcium, Ion: 1.22 mmol/L (ref 1.15–1.40)
Chloride: 106 mmol/L (ref 98–111)
Creatinine, Ser: 0.7 mg/dL (ref 0.44–1.00)
Glucose, Bld: 91 mg/dL (ref 70–99)
HCT: 40 % (ref 36.0–46.0)
Hemoglobin: 13.6 g/dL (ref 12.0–15.0)
Potassium: 4 mmol/L (ref 3.5–5.1)
Sodium: 139 mmol/L (ref 135–145)
TCO2: 22 mmol/L (ref 22–32)

## 2023-02-13 LAB — POCT PREGNANCY, URINE: Preg Test, Ur: NEGATIVE

## 2023-02-13 SURGERY — CYSTOSCOPY/URETEROSCOPY/HOLMIUM LASER/STENT PLACEMENT
Anesthesia: General | Site: Ureter | Laterality: Left

## 2023-02-13 MED ORDER — DEXMEDETOMIDINE HCL IN NACL 80 MCG/20ML IV SOLN
INTRAVENOUS | Status: DC | PRN
  Administered 2023-02-13 (×2): 4 ug via INTRAVENOUS

## 2023-02-13 MED ORDER — PROPOFOL 10 MG/ML IV BOLUS
INTRAVENOUS | Status: AC
  Filled 2023-02-13: qty 20

## 2023-02-13 MED ORDER — AMISULPRIDE (ANTIEMETIC) 5 MG/2ML IV SOLN: 10.0000 mg | Freq: Once | INTRAVENOUS | Status: DC | PRN

## 2023-02-13 MED ORDER — MIDAZOLAM HCL 2 MG/2ML IJ SOLN
INTRAMUSCULAR | Status: AC
Start: 2023-02-13 — End: ?
  Filled 2023-02-13: qty 2

## 2023-02-13 MED ORDER — MIDAZOLAM HCL 2 MG/2ML IJ SOLN
INTRAMUSCULAR | Status: DC | PRN
  Administered 2023-02-13: 2 mg via INTRAVENOUS

## 2023-02-13 MED ORDER — CEFAZOLIN SODIUM-DEXTROSE 2-4 GM/100ML-% IV SOLN
INTRAVENOUS | Status: AC
  Filled 2023-02-13: qty 100

## 2023-02-13 MED ORDER — PROPOFOL 10 MG/ML IV BOLUS
INTRAVENOUS | Status: DC | PRN
  Administered 2023-02-13: 200 mg via INTRAVENOUS

## 2023-02-13 MED ORDER — OXYCODONE HCL 5 MG PO TABS
ORAL_TABLET | ORAL | Status: AC
  Filled 2023-02-13: qty 1

## 2023-02-13 MED ORDER — FENTANYL CITRATE (PF) 100 MCG/2ML IJ SOLN
INTRAMUSCULAR | Status: DC | PRN
  Administered 2023-02-13: 25 ug via INTRAVENOUS
  Administered 2023-02-13: 50 ug via INTRAVENOUS
  Administered 2023-02-13: 25 ug via INTRAVENOUS

## 2023-02-13 MED ORDER — DEXAMETHASONE SODIUM PHOSPHATE 4 MG/ML IJ SOLN
INTRAMUSCULAR | Status: DC | PRN
  Administered 2023-02-13: 8 mg via INTRAVENOUS

## 2023-02-13 MED ORDER — EPHEDRINE SULFATE (PRESSORS) 50 MG/ML IJ SOLN
INTRAMUSCULAR | Status: DC | PRN
  Administered 2023-02-13: 5 mg via INTRAVENOUS

## 2023-02-13 MED ORDER — KETOROLAC TROMETHAMINE 30 MG/ML IJ SOLN
INTRAMUSCULAR | Status: DC | PRN
  Administered 2023-02-13: 30 mg via INTRAVENOUS

## 2023-02-13 MED ORDER — ACETAMINOPHEN 500 MG PO TABS
ORAL_TABLET | ORAL | Status: AC
  Filled 2023-02-13: qty 2

## 2023-02-13 MED ORDER — FENTANYL CITRATE (PF) 100 MCG/2ML IJ SOLN: 25.0000 ug | INTRAMUSCULAR | Status: DC | PRN

## 2023-02-13 MED ORDER — LACTATED RINGERS IV SOLN: INTRAVENOUS | Status: DC

## 2023-02-13 MED ORDER — LIDOCAINE HCL (CARDIAC) PF 100 MG/5ML IV SOSY
PREFILLED_SYRINGE | INTRAVENOUS | Status: DC | PRN
  Administered 2023-02-13: 50 mg via INTRAVENOUS

## 2023-02-13 MED ORDER — IOHEXOL 300 MG/ML  SOLN
INTRAMUSCULAR | Status: DC | PRN
  Administered 2023-02-13: 10 mL via URETHRAL

## 2023-02-13 MED ORDER — STERILE WATER FOR IRRIGATION IR SOLN
Status: DC | PRN
  Administered 2023-02-13: 500 mL

## 2023-02-13 MED ORDER — CEFAZOLIN SODIUM-DEXTROSE 2-4 GM/100ML-% IV SOLN
2.0000 g | INTRAVENOUS | Status: AC
  Administered 2023-02-13: 2 g via INTRAVENOUS

## 2023-02-13 MED ORDER — SODIUM CHLORIDE 0.9 % IR SOLN
Status: DC | PRN
  Administered 2023-02-13: 3000 mL

## 2023-02-13 MED ORDER — ONDANSETRON HCL 4 MG/2ML IJ SOLN
INTRAMUSCULAR | Status: DC | PRN
  Administered 2023-02-13: 4 mg via INTRAVENOUS

## 2023-02-13 MED ORDER — OXYCODONE HCL 5 MG PO TABS
5.0000 mg | ORAL_TABLET | Freq: Once | ORAL | Status: AC | PRN
  Administered 2023-02-13: 5 mg via ORAL

## 2023-02-13 MED ORDER — OXYCODONE HCL 5 MG/5ML PO SOLN: 5.0000 mg | Freq: Once | ORAL | Status: AC | PRN

## 2023-02-13 MED ORDER — FENTANYL CITRATE (PF) 100 MCG/2ML IJ SOLN
INTRAMUSCULAR | Status: AC
  Filled 2023-02-13: qty 2

## 2023-02-13 MED ORDER — PHENYLEPHRINE HCL (PRESSORS) 10 MG/ML IV SOLN
INTRAVENOUS | Status: DC | PRN
  Administered 2023-02-13: 80 ug via INTRAVENOUS

## 2023-02-13 MED ORDER — ACETAMINOPHEN 500 MG PO TABS
1000.0000 mg | ORAL_TABLET | Freq: Once | ORAL | Status: AC
  Administered 2023-02-13: 1000 mg via ORAL

## 2023-02-13 SURGICAL SUPPLY — 19 items
BAG DRAIN URO-CYSTO SKYTR STRL (DRAIN) ×1 IMPLANT
BASKET ZERO TIP NITINOL 2.4FR (BASKET) IMPLANT
CATH URETL OPEN 5X70 (CATHETERS) ×1 IMPLANT
CLOTH BEACON ORANGE TIMEOUT ST (SAFETY) ×1 IMPLANT
DRSG TEGADERM 4X4.75 (GAUZE/BANDAGES/DRESSINGS) IMPLANT
EXTRACTOR STONE 1.7FRX115CM (UROLOGICAL SUPPLIES) IMPLANT
GLOVE BIO SURGEON STRL SZ 6.5 (GLOVE) ×1 IMPLANT
GOWN STRL REUS W/TWL LRG LVL3 (GOWN DISPOSABLE) ×1 IMPLANT
GUIDEWIRE STR DUAL SENSOR (WIRE) ×1 IMPLANT
IV NS IRRIG 3000ML ARTHROMATIC (IV SOLUTION) ×1 IMPLANT
KIT TURNOVER CYSTO (KITS) ×1 IMPLANT
MANIFOLD NEPTUNE II (INSTRUMENTS) ×1 IMPLANT
PACK CYSTO (CUSTOM PROCEDURE TRAY) ×1 IMPLANT
SHEATH NAVIGATOR HD 11/13X28 (SHEATH) IMPLANT
SLEEVE SCD COMPRESS KNEE MED (STOCKING) ×1 IMPLANT
STENT URET 6FRX24 CONTOUR (STENTS) IMPLANT
TRACTIP FLEXIVA PULS ID 200XHI (Laser) IMPLANT
TUBE CONNECTING 12X1/4 (SUCTIONS) ×1 IMPLANT
TUBING UROLOGY SET (TUBING) ×1 IMPLANT

## 2023-02-13 NOTE — Anesthesia Postprocedure Evaluation (Signed)
Anesthesia Post Note  Patient: Audery Amel  Procedure(s) Performed: CYSTOSCOPY/LEFT URETEROSCOPY/RETROGRADE PYELOGRAM/HOLMIUM LASER/STENT PLACEMENT (Left: Ureter)     Patient location during evaluation: PACU Anesthesia Type: General Level of consciousness: awake Pain management: pain level controlled Vital Signs Assessment: post-procedure vital signs reviewed and stable Respiratory status: spontaneous breathing, nonlabored ventilation and respiratory function stable Cardiovascular status: blood pressure returned to baseline and stable Postop Assessment: no apparent nausea or vomiting Anesthetic complications: no   No notable events documented.  Last Vitals:  Vitals:   02/13/23 1025 02/13/23 1259  BP: 135/85 128/79  Pulse: 95   Resp: 16 18  Temp: 36.6 C 37 C  SpO2: 97% 99%    Last Pain:  Vitals:   02/13/23 1315  TempSrc:   PainSc: 6                  Linton Rump

## 2023-02-13 NOTE — Op Note (Signed)
Preoperative diagnosis: left ureteral and renal calculi  Postoperative diagnosis: left ureteral and renal calculi  Procedure:  Cystoscopy left ureteroscopy, laser lithotripsy, basket stone extraction left 63F x 24 ureteral stent placement - no tether left retrograde pyelography with interpretation  Surgeon: Kasandra Knudsen, MD  Anesthesia: General  Complications: None  Intraoperative findings:  Normal urethra Bilateral orthotropic ureteral orifices left retrograde pyelography demonstrated a filling defect within the left ureter consistent with the patient's known calculus without other abnormalities. There moderate hydronephrosis to distal ureter. Bladder mucosa normal without masses   EBL: Minimal  Specimens: left ureteral calculus  Disposition of specimens: [facility] for stone analysis  Indication: Carrie Bray is a 28 y.o.   patient with a 6mm left ureteral stone and associated left symptoms. After reviewing the management options for treatment, the patient elected to proceed with the above surgical procedure(s). We have discussed the potential benefits and risks of the procedure, side effects of the proposed treatment, the likelihood of the patient achieving the goals of the procedure, and any potential problems that might occur during the procedure or recuperation. Informed consent has been obtained.   Description of procedure:  The patient was taken to the operating room and general anesthesia was induced.  The patient was placed in the dorsal lithotomy position, prepped and draped in the usual sterile fashion, and preoperative antibiotics were administered. A preoperative time-out was performed.   Cystourethroscopy was performed.  The patient's urethra was examined and was normal.The bladder was then systematically examined in its entirety. There was no evidence for any bladder tumors, stones, or other mucosal pathology.    Attention then turned to  the left ureteral orifice and a ureteral catheter was used to intubate the ureteral orifice.  Omnipaque contrast was injected through the ureteral catheter and a retrograde pyelogram was performed with findings as dictated above.  A 0.38 sensor guidewire was then advanced up the left ureter into the renal pelvis under fluoroscopic guidance. The 4.5 Fr semirigid ureteroscope was then advanced into the ureter next to the guidewire and the calculus was identified.   The stone was then fragmented with the 242 micron holmium laser fiber. Next, a second sensor wire was advanced through the ureteroscope and up to the kidney with fluoroscopy.  The ureteroscope was removed. One wire was secured as the safety wire and a second wire was used to guide the ureteral access sheath into the proximal ureter. Flexible ureteroscopy took place. Another stone was encountered and fragmented in the lower pole. Stones were then removed from the ureter and kidney with a 0 tip basket.  This continued until visualization was poor. Reinspection of the ureter revealed no remaining visible stones or fragments.   The wire was then backloaded through the cystoscope and a ureteral stent was advance over the wire using Seldinger technique.  The stent was positioned appropriately under fluoroscopic and cystoscopic guidance.  The wire was then removed with an adequate stent curl noted in the renal pelvis as well as in the bladder.  The bladder was then emptied and the procedure ended.  The patient appeared to tolerate the procedure well and without complications.  The patient was able to be awakened and transferred to the recovery unit in satisfactory condition.   Disposition: No tether was left and stent will be removed in the office in about a week.

## 2023-02-13 NOTE — Anesthesia Procedure Notes (Signed)
Date/Time: 02/13/2023 11:59 AM  Performed by: Earmon Phoenix, CRNAPre-anesthesia Checklist: Patient identified, Emergency Drugs available, Suction available, Patient being monitored and Timeout performed Patient Re-evaluated:Patient Re-evaluated prior to induction Oxygen Delivery Method: Circle system utilized Preoxygenation: Pre-oxygenation with 100% oxygen Induction Type: IV induction Ventilation: Mask ventilation without difficulty LMA: LMA inserted LMA Size: 4.0 Number of attempts: 1 Placement Confirmation: positive ETCO2 and breath sounds checked- equal and bilateral Tube secured with: Tape Dental Injury: Teeth and Oropharynx as per pre-operative assessment

## 2023-02-13 NOTE — Discharge Instructions (Addendum)
DISCHARGE INSTRUCTIONS FOR KIDNEY STONE/URETERAL STENT   MEDICATIONS:  1. Resume all your other meds from home  2. AZO over the counter can help with the burning/stinging when you urinate. 3. Oxycodone is for moderate/severe pain, otherwise taking up to 1000 mg every 6 hours of plainTylenol will help treat your pain.      ACTIVITY:  1. No strenuous activity x 1week  2. No driving while on narcotic pain medications  3. Drink plenty of water  4. Continue to walk at home - you can still get blood clots when you are at home, so keep active, but don't over do it.  5. May return to work/school tomorrow or when you feel ready   BATHING:  1. You can shower and we recommend daily showers    SIGNS/SYMPTOMS TO CALL:  Please call us if you have a fever greater than 101.5, uncontrolled nausea/vomiting, uncontrolled pain, dizziness, unable to urinate, bloody urine, chest pain, shortness of breath, leg swelling, leg pain, redness around wound, drainage from wound, or any other concerns or questions.   You can reach [place] at [phone].   FOLLOW-UP:  1. You will be contacted for follow up appointment and stent removal in about 1 week  No acetaminophen/Tylenol until after 4 pm today if needed. Do not combine with oxycodone-acetaminophen.    Post Anesthesia Home Care Instructions  Activity: Get plenty of rest for the remainder of the day. A responsible individual must stay with you for 24 hours following the procedure.  For the next 24 hours, DO NOT: -Drive a car -Advertising copywriter -Drink alcoholic beverages -Take any medication unless instructed by your physician -Make any legal decisions or sign important papers.  Meals: Start with liquid foods such as gelatin or soup. Progress to regular foods as tolerated. Avoid greasy, spicy, heavy foods. If nausea and/or vomiting occur, drink only clear liquids until the nausea and/or vomiting subsides. Call your physician if vomiting  continues.  Special Instructions/Symptoms: Your throat may feel dry or sore from the anesthesia or the breathing tube placed in your throat during surgery. If this causes discomfort, gargle with warm salt water. The discomfort should disappear within 24 hours.

## 2023-02-13 NOTE — Transfer of Care (Signed)
Immediate Anesthesia Transfer of Care Note  Patient: Carrie Bray  Procedure(s) Performed: CYSTOSCOPY/LEFT URETEROSCOPY/RETROGRADE PYELOGRAM/HOLMIUM LASER/STENT PLACEMENT (Left: Ureter)  Patient Location: PACU  Anesthesia Type:General  Level of Consciousness: awake and patient cooperative  Airway & Oxygen Therapy: Patient Spontanous Breathing and Patient connected to nasal cannula oxygen  Post-op Assessment: Report given to RN and Post -op Vital signs reviewed and stable  Post vital signs: Reviewed and stable  Last Vitals:  Vitals Value Taken Time  BP    Temp    Pulse 92 02/13/23 1259  Resp    SpO2 95 % 02/13/23 1259  Vitals shown include unfiled device data.  Last Pain:  Vitals:   02/13/23 1025  TempSrc: Oral  PainSc: 0-No pain      Patients Stated Pain Goal: 4 (02/13/23 1025)  Complications: No notable events documented.

## 2023-02-13 NOTE — Anesthesia Preprocedure Evaluation (Addendum)
Anesthesia Evaluation  Patient identified by MRN, date of birth, ID band Patient awake    Reviewed: Allergy & Precautions, NPO status , Patient's Chart, lab work & pertinent test results  History of Anesthesia Complications Negative for: history of anesthetic complications  Airway Mallampati: I  TM Distance: >3 FB Neck ROM: Full    Dental  (+) Dental Advisory Given   Pulmonary neg shortness of breath, neg COPD, Recent URI  (COVID 01/24/2023), Resolved, former smoker   Pulmonary exam normal breath sounds clear to auscultation       Cardiovascular hypertension (metoprolol, valsartan), Pt. on medications and Pt. on home beta blockers (-) angina (-) Past MI, (-) Cardiac Stents and (-) CABG (-) dysrhythmias  Rhythm:Regular Rate:Normal     Neuro/Psych  PSYCHIATRIC DISORDERS Anxiety Depression    negative neurological ROS     GI/Hepatic negative GI ROS, Neg liver ROS,,,  Endo/Other  negative endocrine ROS    Renal/GU Renal disease (stones)     Musculoskeletal   Abdominal   Peds  Hematology negative hematology ROS (+)   Anesthesia Other Findings   Reproductive/Obstetrics                             Anesthesia Physical Anesthesia Plan  ASA: 2  Anesthesia Plan: General   Post-op Pain Management: Tylenol PO (pre-op)*   Induction: Intravenous  PONV Risk Score and Plan: 3 and Ondansetron, Dexamethasone and Treatment may vary due to age or medical condition  Airway Management Planned: LMA  Additional Equipment:   Intra-op Plan:   Post-operative Plan: Extubation in OR  Informed Consent: I have reviewed the patients History and Physical, chart, labs and discussed the procedure including the risks, benefits and alternatives for the proposed anesthesia with the patient or authorized representative who has indicated his/her understanding and acceptance.     Dental advisory given  Plan  Discussed with: CRNA and Anesthesiologist  Anesthesia Plan Comments: (Risks of general anesthesia discussed including, but not limited to, sore throat, hoarse voice, chipped/damaged teeth, injury to vocal cords, nausea and vomiting, allergic reactions, lung infection, heart attack, stroke, and death. All questions answered. )        Anesthesia Quick Evaluation

## 2023-02-13 NOTE — H&P (Signed)
CC/HPI: cc: urolithiasis   01/23/23: 28 year old woman with a history of urolithiasis who last underwent right ESWL in January 2023 now comes in with a 6 mm left UPJ calculus and bilateral nonobstructing renal calculi. She was seen in the ED earlier this morning. CT of abdomen pelvis was reviewed. No fevers, chills, nausea or vomiting. She does respond to Toradol. She did not have a good experience with ESWL last year and had severe pain following this procedure.     ALLERGIES: Clindamycin Clindamycin Palmitate    MEDICATIONS: Cipro  Bupropion Hcl  METOPROLOL SUCCINATE PO Daily  VALSARTAN PO Daily     GU PSH: ESWL - 02/04/2021     NON-GU PSH: No Non-GU PSH    GU PMH: Ureteral calculus - 02/05/2021, - 01/31/2021 Flank Pain - 01/31/2021 Gross hematuria - 01/31/2021    NON-GU PMH: Bacteriuria - 01/31/2021 Anxiety - 2017 Depression - 2017 Hypertension - 2016 GERD    FAMILY HISTORY: Diabetes - Runs In Family Heart Disease - Runs In Family High Blood Pressure - Runs In Family Kidney Failure - Grandmother   SOCIAL HISTORY: Marital Status: Single Preferred Language: English; Ethnicity: Not Hispanic Or Latino; Race: White Current Smoking Status: Patient does not smoke anymore. Has not smoked since 07/29/2017. Smoked 1 pack per day.   Tobacco Use Assessment Completed: Used Tobacco in last 30 days? Does not use smokeless tobacco. Drinks 4 drinks per month. Social Drinker.  Does not use drugs. Drinks 2 caffeinated drinks per day. Has not had a blood transfusion. 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    REVIEW OF SYSTEMS:    GU Review Female:   Patient denies frequent urination, hard to postpone urination, burning /pain with urination, get up at night to urinate, leakage of urine, stream starts and stops, trouble starting your stream, have to strain to urinate, and being pregnant.  Gastrointestinal (Upper):   Patient denies nausea, vomiting, and indigestion/ heartburn.  Gastrointestinal  (Lower):   Patient denies diarrhea and constipation.  Constitutional:   Patient denies fever, night sweats, weight loss, and fatigue.  Skin:   Patient denies skin rash/ lesion and itching.  Eyes:   Patient denies blurred vision and double vision.  Ears/ Nose/ Throat:   Patient denies sore throat and sinus problems.  Hematologic/Lymphatic:   Patient denies swollen glands and easy bruising.  Cardiovascular:   Patient denies leg swelling and chest pains.  Respiratory:   Patient denies cough and shortness of breath.  Endocrine:   Patient denies excessive thirst.  Musculoskeletal:   Patient denies back pain and joint pain.  Neurological:   Patient denies headaches and dizziness.  Psychologic:   Patient denies depression and anxiety.   Notes: Left flank pain    VITAL SIGNS:      01/23/2023 03:12 PM  BP 134/86 mmHg  Pulse 112 /min  Temperature 98.2 F / 36.7 C   MULTI-SYSTEM PHYSICAL EXAMINATION:    Constitutional: Well-nourished. No physical deformities. Normally developed. Good grooming.  Neck: Neck symmetrical, not swollen. Normal tracheal position.  Respiratory: No labored breathing, no use of accessory muscles.   Skin: No paleness, no jaundice, no cyanosis. No lesion, no ulcer, no rash.  Neurologic / Psychiatric: Oriented to time, oriented to place, oriented to person. No depression, no anxiety, no agitation.  Eyes: Normal conjunctivae. Normal eyelids.  Ears, Nose, Mouth, and Throat: Left ear no scars, no lesions, no masses. Right ear no scars, no lesions, no masses. Nose no scars, no lesions, no masses. Normal  hearing. Normal lips.  Musculoskeletal: Normal gait and station of head and neck.     Complexity of Data:  Records Review:   Previous Patient Records, POC Tool  Urine Test Review:   Urinalysis  X-Ray Review: C.T. Abdomen/Pelvis: Reviewed Films. Reviewed Report. Discussed With Patient.  IMPRESSION:  1. Mild left hydronephrosis related to an obstructive 6 mm left   ureteropelvic junction stone.  2. Additional bilateral nonobstructive renal calculi.    Electronically Signed  By: Gaylyn Rong M.D.  On: 01/22/2023 21:47    PROCEDURES:          Urinalysis w/Scope Dipstick Dipstick Cont'd Micro  Color: Yellow Bilirubin: Neg mg/dL WBC/hpf: NS (Not Seen)  Appearance: Cloudy Ketones: Neg mg/dL RBC/hpf: 40 - 40/JWJ  Specific Gravity: 1.025 Blood: 3+ ery/uL Bacteria: Rare (0-9/hpf)  pH: 6.0 Protein: Trace mg/dL Cystals: NS (Not Seen)  Glucose: Neg mg/dL Urobilinogen: 0.2 mg/dL Casts: NS (Not Seen)    Nitrites: Neg Trichomonas: Not Present    Leukocyte Esterase: Neg leu/uL Mucous: Present      Epithelial Cells: 0 - 5/hpf      Yeast: NS (Not Seen)      Sperm: Not Present         Ketoralac 30mg  - [id number], [id number] 0 Wasted    Qty: 30 Adm. By: Kasandra Knudsen, M.D.  Unit: mg Lot No [id number]  Route: IM Exp. Date 11/14/2023  Freq: None Mfgr.:   Site: Right Buttock   ASSESSMENT:      ICD-10 Details  1 GU:   Ureteral calculus - N20.1 Acute, Uncomplicated  2   Microscopic hematuria - R31.21 Undiagnosed New Problem  3   Renal calculus - N20.0 Chronic, Stable   PLAN:            Medications New Meds: Ketorolac Tromethamine 10 mg tablet 1 tablet PO Q 8 H PRN pt tolerated IV toradol in ED  #15  0 Refill(s)  Tamsulosin Hcl 0.4 mg capsule 1 capsule PO Q HS   #30  3 Refill(s)  Pharmacy Name:  [id number]  Address:  [address]   [place] [address]  Phone:  [phone]  Fax:  [phone]            Document Letter(s):  Created for Patient: Clinical Summary         Notes:   Urolithiasis:  -Patient received Toradol in the office today and this was sent to the pharmacy as well as tamsulosin. She did have a prescription for hydrocodone and Zofran sent from the ED earlier this morning.  -We discussed ESWL again versus left-sided ureteroscopy. Patient did not have a good experience with ESWL and would like to move  forward with left-sided ureteroscopy to clear the entire side. Will address the possibility of a right sided ureteroscopy in the future. Risks and benefits of the procedure were discussed including but not limited to pain, bleeding, infection, damage to surrounding structures, ureteral stricture, need for staged procedure, inability to remove stones, stent discomfort.  -She is given strict return precautions including fever and systemic symptoms

## 2023-02-14 ENCOUNTER — Encounter (HOSPITAL_BASED_OUTPATIENT_CLINIC_OR_DEPARTMENT_OTHER): Payer: Self-pay | Admitting: Urology
